# Patient Record
Sex: Male | Born: 1985 | Race: Black or African American | Hispanic: No | Marital: Single
Health system: Southern US, Community
[De-identification: ages and names within clinical notes are randomized; demographics above are authoritative.]

---

## 2020-08-07 ENCOUNTER — Emergency Department (HOSPITAL_COMMUNITY): Payer: Self-pay

## 2020-08-07 ENCOUNTER — Other Ambulatory Visit: Payer: Self-pay

## 2020-08-07 ENCOUNTER — Encounter (HOSPITAL_COMMUNITY): Payer: Self-pay

## 2020-08-07 DIAGNOSIS — R0781 Pleurodynia: Secondary | ICD-10-CM | POA: Insufficient documentation

## 2020-08-07 DIAGNOSIS — Z5321 Procedure and treatment not carried out due to patient leaving prior to being seen by health care provider: Secondary | ICD-10-CM | POA: Insufficient documentation

## 2020-08-07 NOTE — ED Triage Notes (Signed)
Pt arrives EMS with c/o left sided rib pain starting around 1000 today. Denies injury.

## 2020-08-08 ENCOUNTER — Emergency Department (HOSPITAL_COMMUNITY)
Admission: EM | Admit: 2020-08-08 | Discharge: 2020-08-08 | Disposition: A | Discharge status: Home / Self Care | Payer: Self-pay | Attending: Emergency Medicine | Admitting: Emergency Medicine

## 2020-08-08 NOTE — ED Notes (Signed)
Pt eloped prior to room call. Called 3X.

## 2022-01-28 IMAGING — CR DG RIBS W/ CHEST 3+V*L*
5 series · 5 of 5 positions shown · non-contrast
Comparison: None.

CLINICAL DATA: Left rib pain which began at [DATE]

EXAM:
LEFT RIBS AND CHEST - 3+ VIEW

[w chest pa]
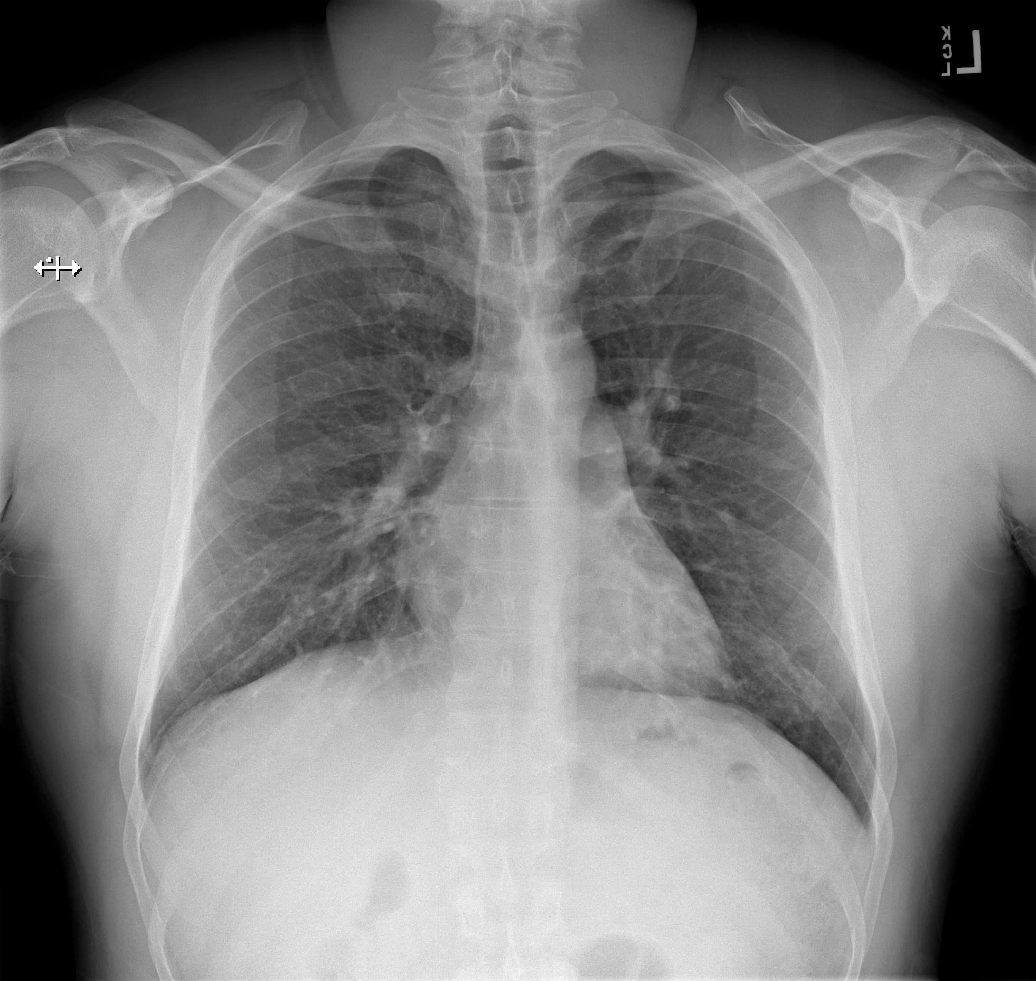

[w ribs ap upper left]
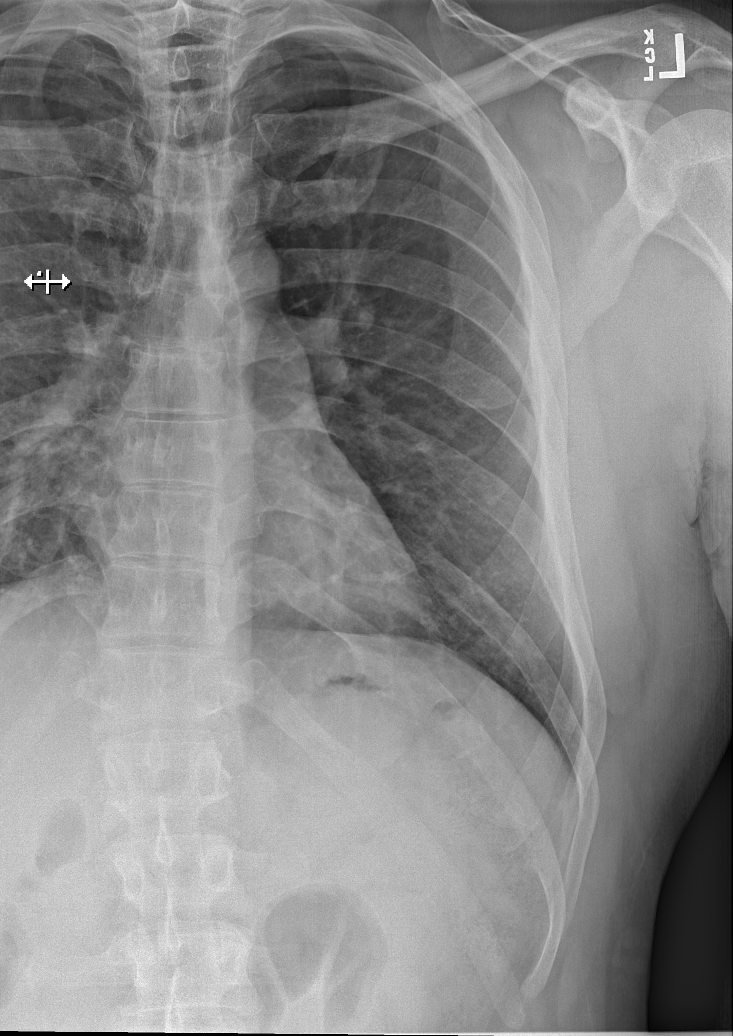

[w ribs ap lower left]
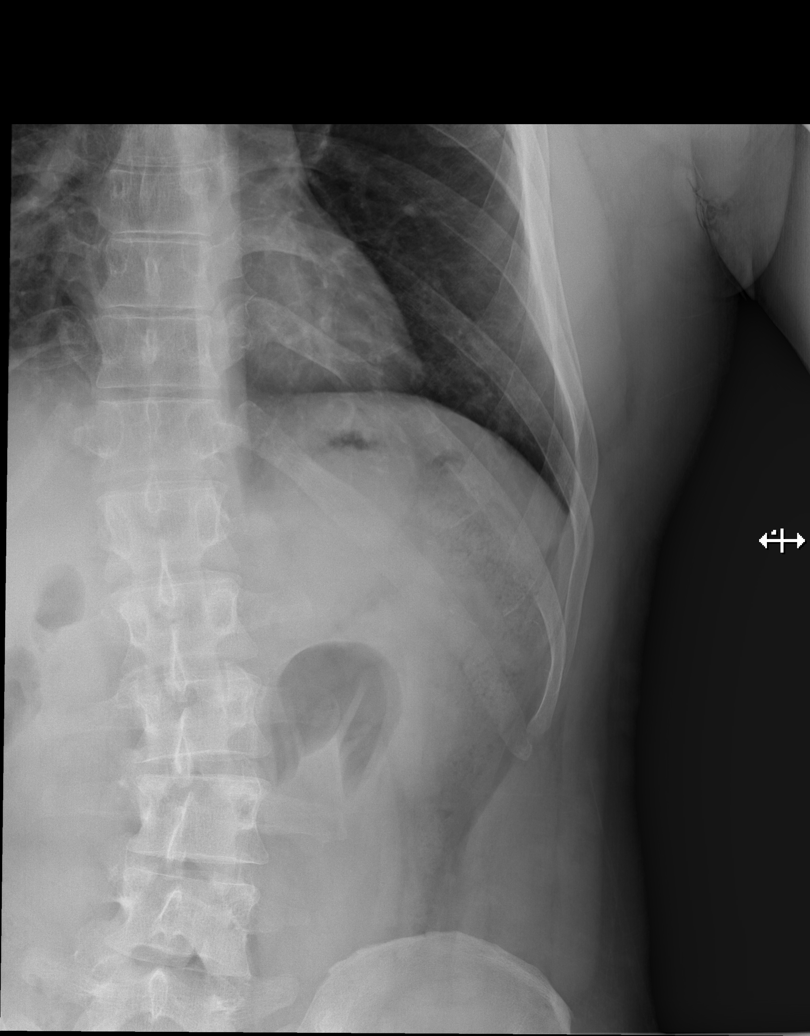

[w ribs obl left (1 of 2)]
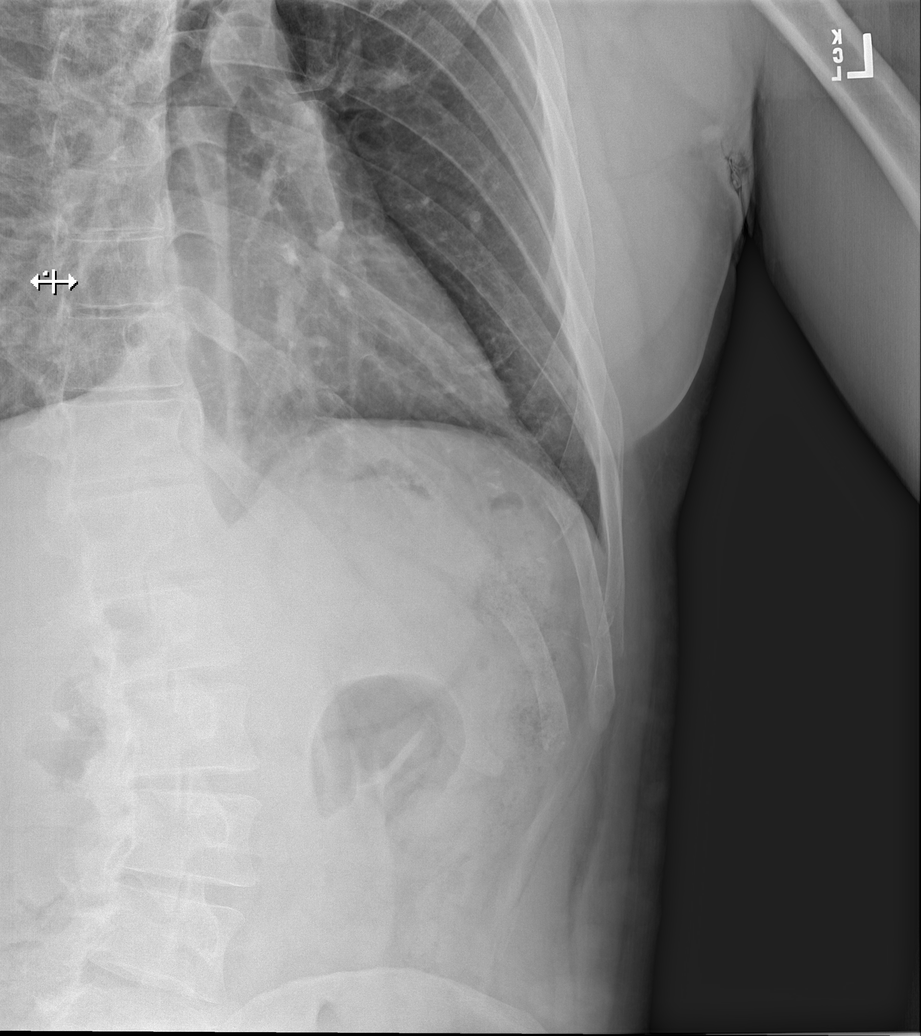

[w ribs obl left (2 of 2)]
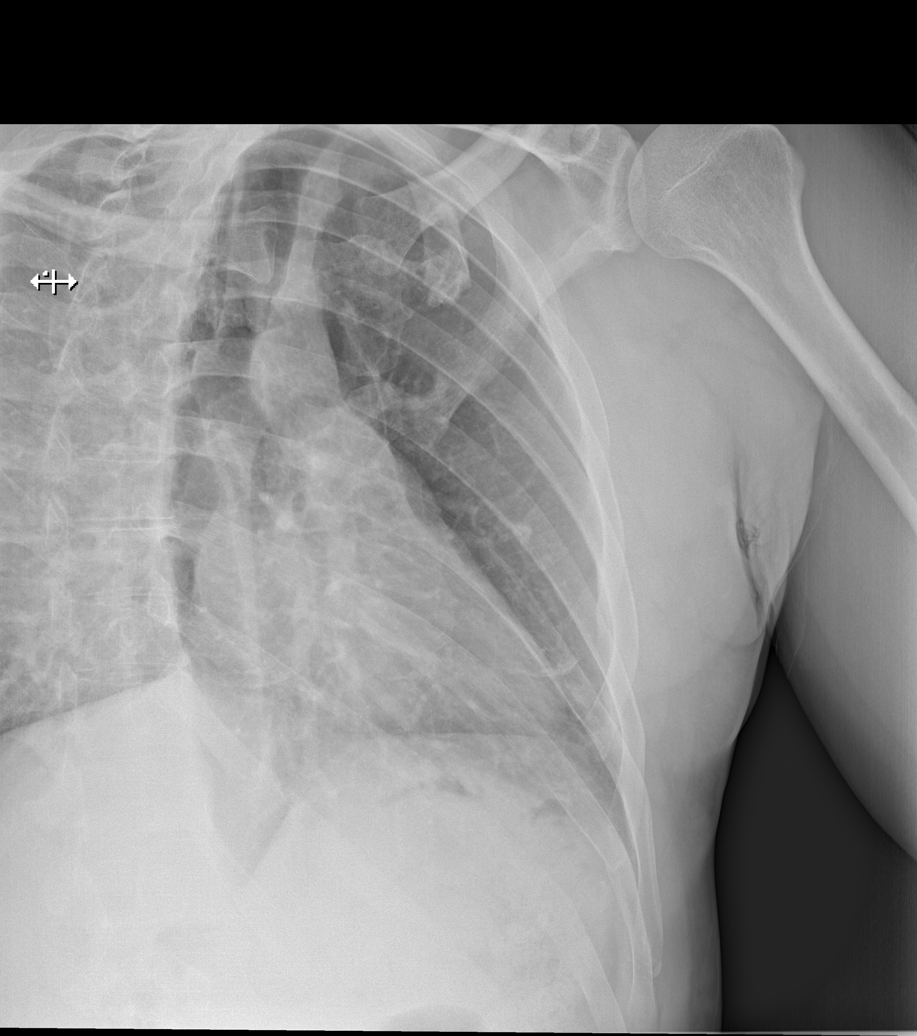

[5 of 5 positions shown; findings below may reference images not displayed]

FINDINGS: No fracture or other bone lesions are seen involving the ribs. There
is no evidence of pneumothorax or pleural effusion. Both lungs are
clear. Heart size and mediastinal contours are within normal limits.
IMPRESSION: 1. No acute cardiopulmonary abnormality.
2. No acute rib fracture or other acute abnormality of the chest
wall. i
